# Patient Record
Sex: Male | Born: 1990
Health system: Southern US, Community
[De-identification: ages and names within clinical notes are randomized; demographics above are authoritative.]

## PROBLEM LIST (undated history)

## (undated) HISTORY — PX: MANDIBLE FRACTURE SURGERY: SHX706

## (undated) HISTORY — PX: COSMETIC SURGERY: SHX468

---

## 2016-10-03 ENCOUNTER — Ambulatory Visit
Admission: EM | Admit: 2016-10-03 | Discharge: 2016-10-03 | Disposition: A | Discharge status: Home / Self Care | Payer: 59 | Attending: Family Medicine | Admitting: Family Medicine

## 2016-10-03 ENCOUNTER — Ambulatory Visit (INDEPENDENT_AMBULATORY_CARE_PROVIDER_SITE_OTHER): Payer: 59

## 2016-10-03 DIAGNOSIS — S02601B Fracture of unspecified part of body of right mandible, initial encounter for open fracture: Secondary | ICD-10-CM

## 2016-10-03 DIAGNOSIS — S02642A Fracture of ramus of left mandible, initial encounter for closed fracture: Secondary | ICD-10-CM | POA: Diagnosis not present
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
## 2016-10-03 NOTE — Discharge Instructions (Signed)
Re[facility] now for further evaluation and management

## 2017-02-20 ENCOUNTER — Ambulatory Visit (INDEPENDENT_AMBULATORY_CARE_PROVIDER_SITE_OTHER): Payer: 59 | Admitting: Podiatry

## 2017-02-20 ENCOUNTER — Encounter: Payer: Self-pay | Admitting: Podiatry

## 2017-02-20 DIAGNOSIS — M79609 Pain in unspecified limb: Secondary | ICD-10-CM

## 2017-02-20 DIAGNOSIS — B351 Tinea unguium: Secondary | ICD-10-CM | POA: Diagnosis not present

## 2017-02-20 NOTE — Progress Notes (Signed)
   Subjective:    Patient ID: Paul Mcconnell, male    DOB: 02-02-1991, 26 y.o.   MRN: [id number] . HPIthis patient presents to the office stating that his toenails are discolored and are falling off his toes.  He says there is no pain or drainage from the toenails.  He does give a history of having played soccer years ago.  He says that the toenails on his right foot while worse than the toenails on his left foot.  He  has provided no treatment nor sought any professional help.  He presents the office today for an evaluation of these nails.    Review of Systems  All other systems reviewed and are negative.      Objective:   Physical Exam General Appearance  Alert, conversant and in no acute stress.  Vascular  Dorsalis pedis and posterior pulses are palpable  bilaterally.  Capillary return is within normal limits  Bilaterally. Temperature is within normal limits  Bilaterally  Neurologic  Senn-Weinstein monofilament wire test within normal limits  bilaterally. Muscle power  Within normal limits bilaterally.  Nails Thick disfigured discolored nails with subungual debris 3-5 right foot.. No evidence of bacterial infection or drainage bilaterally.  Orthopedic  No limitations of motion of motion feet bilaterally.  No crepitus or effusions noted.  Mild HAV  B/L.  Skin  normotropic skin with no porokeratosis noted bilaterally.  No signs of infections or ulcers noted.          Assessment & Plan:  Onychomycosis  3-5 right  IE  Examined his foot and the 3 nails on his right foot appear to be fungal in nature.  His nails have only last week and cut short.  I was unable to get a sample of the nail to send to the lab.  I told him to return to the office in 3 weeks at which time ample of the nail will  taken and sent to the lab.  o consider Lamisil treatment in the future.   Helane GuntherGregory Roark Rufo DPM

## 2017-03-06 ENCOUNTER — Encounter: Payer: Self-pay | Admitting: *Deleted

## 2017-03-06 ENCOUNTER — Ambulatory Visit
Admission: EM | Admit: 2017-03-06 | Discharge: 2017-03-06 | Disposition: A | Discharge status: Home / Self Care | Payer: 59 | Attending: Family Medicine | Admitting: Family Medicine

## 2017-03-06 DIAGNOSIS — L03012 Cellulitis of left finger: Secondary | ICD-10-CM

## 2017-03-06 MED ORDER — SULFAMETHOXAZOLE-TRIMETHOPRIM 800-160 MG PO TABS: 1.0000 | ORAL_TABLET | Freq: Two times a day (BID) | ORAL | 0 refills | Status: DC

## 2017-03-06 NOTE — ED Triage Notes (Signed)
Left middle finger nail area is red, edematous, and painful, x1 week. Denies injury.
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
## 2017-03-13 ENCOUNTER — Ambulatory Visit: Payer: 59 | Admitting: Podiatry

## 2017-03-20 ENCOUNTER — Encounter: Payer: Self-pay | Admitting: Podiatry

## 2017-03-20 ENCOUNTER — Ambulatory Visit (INDEPENDENT_AMBULATORY_CARE_PROVIDER_SITE_OTHER): Payer: 59 | Admitting: Podiatry

## 2017-03-20 DIAGNOSIS — B351 Tinea unguium: Secondary | ICD-10-CM

## 2017-03-20 DIAGNOSIS — L603 Nail dystrophy: Secondary | ICD-10-CM

## 2017-03-20 DIAGNOSIS — M79609 Pain in unspecified limb: Secondary | ICD-10-CM

## 2017-03-20 NOTE — Progress Notes (Signed)
   Subjective:    Patient ID: Paul Mcconnell, male    DOB: 12/20/1990, 26 y.o.   MRN: [id number] . HPIthis patient presents to the office stating that his toenails are discolored and are falling off his toes.  He says there is no pain or drainage from the toenails.  He does give a history of having played soccer years ago.  He says that the toenails on his right foot while worse than the toenails on his left foot.  He  has provided no treatment nor sought any professional help.  He presents the office today for an evaluation of these nails.    Review of Systems  All other systems reviewed and are negative.      Objective:   Physical Exam General Appearance  Alert, conversant and in no acute stress.  Vascular  Dorsalis pedis and posterior pulses are palpable  bilaterally.  Capillary return is within normal limits  Bilaterally. Temperature is within normal limits  Bilaterally  Neurologic  Senn-Weinstein monofilament wire test within normal limits  bilaterally. Muscle power  Within normal limits bilaterally.  Nails Thick disfigured discolored nails with subungual debris 3-5 right foot.. No evidence of bacterial infection or drainage bilaterally.  Orthopedic  No limitations of motion of motion feet bilaterally.  No crepitus or effusions noted.  Mild HAV  B/L.  Skin  normotropic skin with no porokeratosis noted bilaterally.  No signs of infections or ulcers noted.          Assessment & Plan:  Onychomycosis  3-5 right  Nail sample was taken from his nails  and was sent to the lab to determine if there are any fungal elements.  Will call with the results   Helane GuntherGregory Glendell Schlottman DPM

## 2017-04-05 ENCOUNTER — Telehealth: Payer: Self-pay | Admitting: Podiatry

## 2017-04-05 DIAGNOSIS — Z79899 Other long term (current) drug therapy: Secondary | ICD-10-CM

## 2017-04-05 NOTE — Telephone Encounter (Signed)
Patient called wanting his results from [facility]. Looks like report is in hi chart. Pt states if positive for fungus you can [facility] [place]. If not, call him if he needs to make another appointment.

## 2017-04-06 DIAGNOSIS — Z79899 Other long term (current) drug therapy: Secondary | ICD-10-CM | POA: Diagnosis not present

## 2017-04-06 MED ORDER — TERBINAFINE HCL 250 MG PO TABS: 250.0000 mg | ORAL_TABLET | Freq: Every day | ORAL | 0 refills | Status: DC

## 2017-04-06 NOTE — Telephone Encounter (Signed)
Patient has been notified of results and informed to pick up order for lab work.  He verbally understood not to start Lamisil until notified of lab results.  Script for Lamisil has been sent to pharmacy

## 2017-04-06 NOTE — Addendum Note (Signed)
Addended by: Geraldine ContrasVENABLE, ANGELA D on: 04/06/2017 10:05 AM   Modules accepted: Orders

## 2017-04-06 NOTE — Telephone Encounter (Addendum)
Helane GuntherMayer, Gregory, DPM sent to Constance HawVenable, Leianne Callins D, LPN        He has fungus. Have him come by for blood work and send him in Lamisil for 90 day supply, he can follow up as needed  Previous Messages        Fungus culture w smear  Order: [id number]  Status:  Final result Visible to patient:  No (Not Released) Dx:  Nail dystrophy         Specimen Collected: 03/20/17 Last Resulted: 04/03/17 08:18      Order Details     View Encounter     Lab and Collection Details     Routing     Result History        Scans on Order [id number]   Scan on 04/03/2017 8:19 AM by Araceli BoucheHoneycutt, Tammy R: [facility]

## 2017-04-07 LAB — HEPATIC FUNCTION PANEL
ALT: 39 IU/L (ref 0–44)
AST: 24 IU/L (ref 0–40)
Albumin: 4.8 g/dL (ref 3.5–5.5)
Alkaline Phosphatase: 83 IU/L (ref 39–117)
BILIRUBIN TOTAL: 1.2 mg/dL (ref 0.0–1.2)
Bilirubin, Direct: 0.26 mg/dL (ref 0.00–0.40)
Total Protein: 7.5 g/dL (ref 6.0–8.5)

## 2017-04-11 ENCOUNTER — Telehealth: Payer: Self-pay | Admitting: Podiatry

## 2017-04-11 NOTE — Telephone Encounter (Signed)
Angie, can you please call this patient and discuss his lab results with him

## 2017-04-11 NOTE — Telephone Encounter (Signed)
Patient called wanting to know the results of his lab work liver test and also the results from the pathology. Advised pt that doctor would need to review and someone would give him a call. Results are attached in the chart for both.

## 2017-04-12 NOTE — Telephone Encounter (Signed)
Patient has been notified of lab results and informed ok to start medication.

## 2017-06-26 DIAGNOSIS — Z23 Encounter for immunization: Secondary | ICD-10-CM | POA: Diagnosis not present

## 2018-06-25 ENCOUNTER — Ambulatory Visit
Admission: EM | Admit: 2018-06-25 | Discharge: 2018-06-25 | Disposition: A | Discharge status: Home / Self Care | Payer: 59 | Attending: Family Medicine | Admitting: Family Medicine

## 2018-06-25 ENCOUNTER — Encounter: Payer: Self-pay | Admitting: Emergency Medicine

## 2018-06-25 ENCOUNTER — Other Ambulatory Visit: Payer: Self-pay

## 2018-06-25 DIAGNOSIS — J029 Acute pharyngitis, unspecified: Secondary | ICD-10-CM

## 2018-06-25 LAB — RAPID STREP SCREEN (MED CTR MEBANE ONLY): Streptococcus, Group A Screen (Direct): NEGATIVE

## 2018-06-25 MED ORDER — AMOXICILLIN 500 MG PO TABS: 500.0000 mg | ORAL_TABLET | Freq: Two times a day (BID) | ORAL | 0 refills | Status: AC
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
## 2018-06-25 NOTE — Discharge Instructions (Signed)
I am placing you on antibiotics while awaiting the culture.  Take care  Dr. Adriana Simas

## 2018-06-25 NOTE — ED Triage Notes (Signed)
Patient c/o cough and sore throat that started 4 days ago. Denies fever.

## 2018-06-27 ENCOUNTER — Telehealth (HOSPITAL_COMMUNITY): Payer: Self-pay | Admitting: Emergency Medicine

## 2018-06-27 LAB — CULTURE, GROUP A STREP (THRC)

## 2018-06-27 NOTE — Telephone Encounter (Signed)
Culture is positive for group A Strep germ.  Prescription for penicillin V 500mg  bid x 10d #20 no refills sent to the pharmacy of record. Pt contacted and made aware. Recheck for further evaluation if symptoms are not improving.  Verbalized understanding.   Pt was treated with antibiotics; attempted call no answer and no voicemail

## 2018-07-23 IMAGING — CR DG MANDIBLE 4+V
4 series · 5 of 5 positions shown · non-contrast
Comparison: None.

CLINICAL DATA: 25-year-old male with acute left jaw pain and
swelling following injury yesterday.

EXAM:
MANDIBLE - 4+ VIEW

[mandible pa]
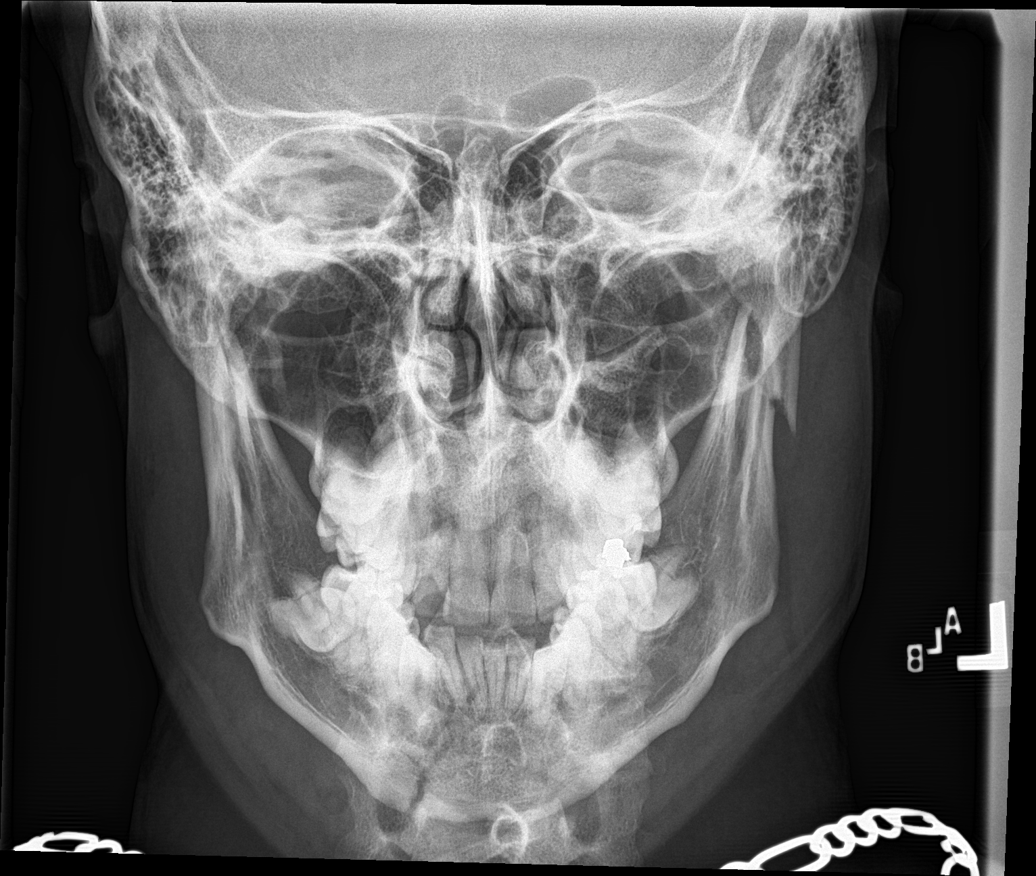

[mandible obl (1 of 2)]
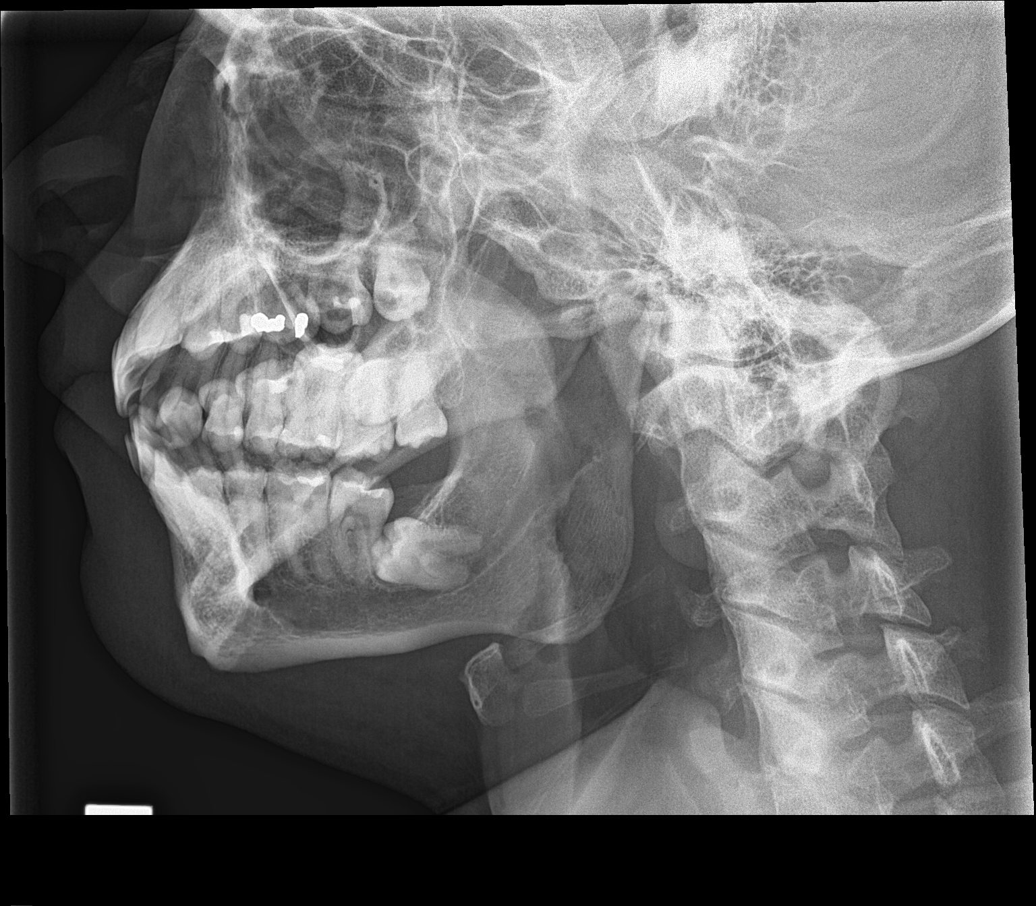

[Series 3: mandible obl · 0.14mm/px · 2 of 2 slices shown (2 of 2)]
[im 1/2]
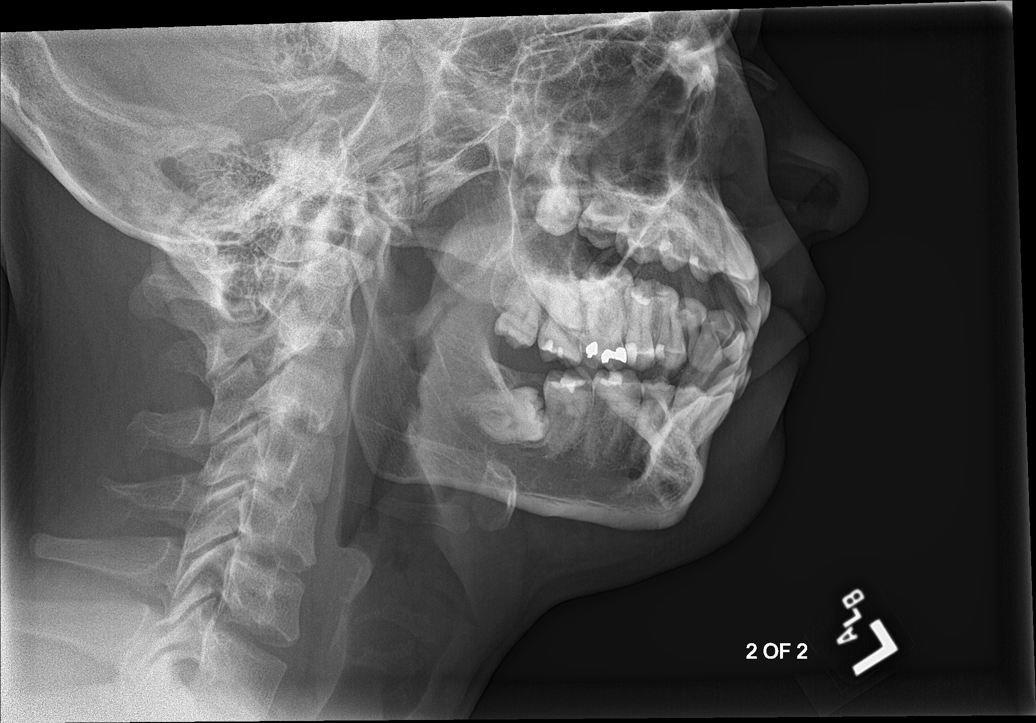
[im 2/2]
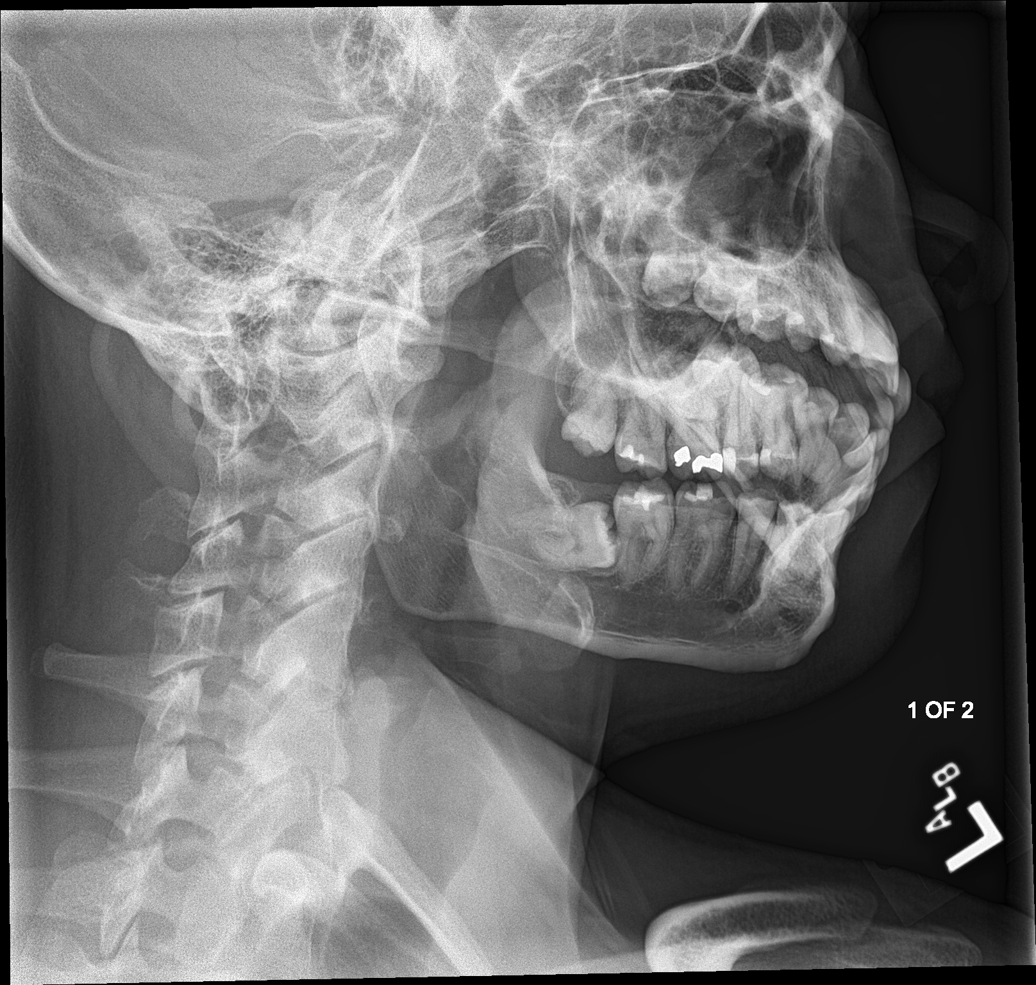

[mandible townes]
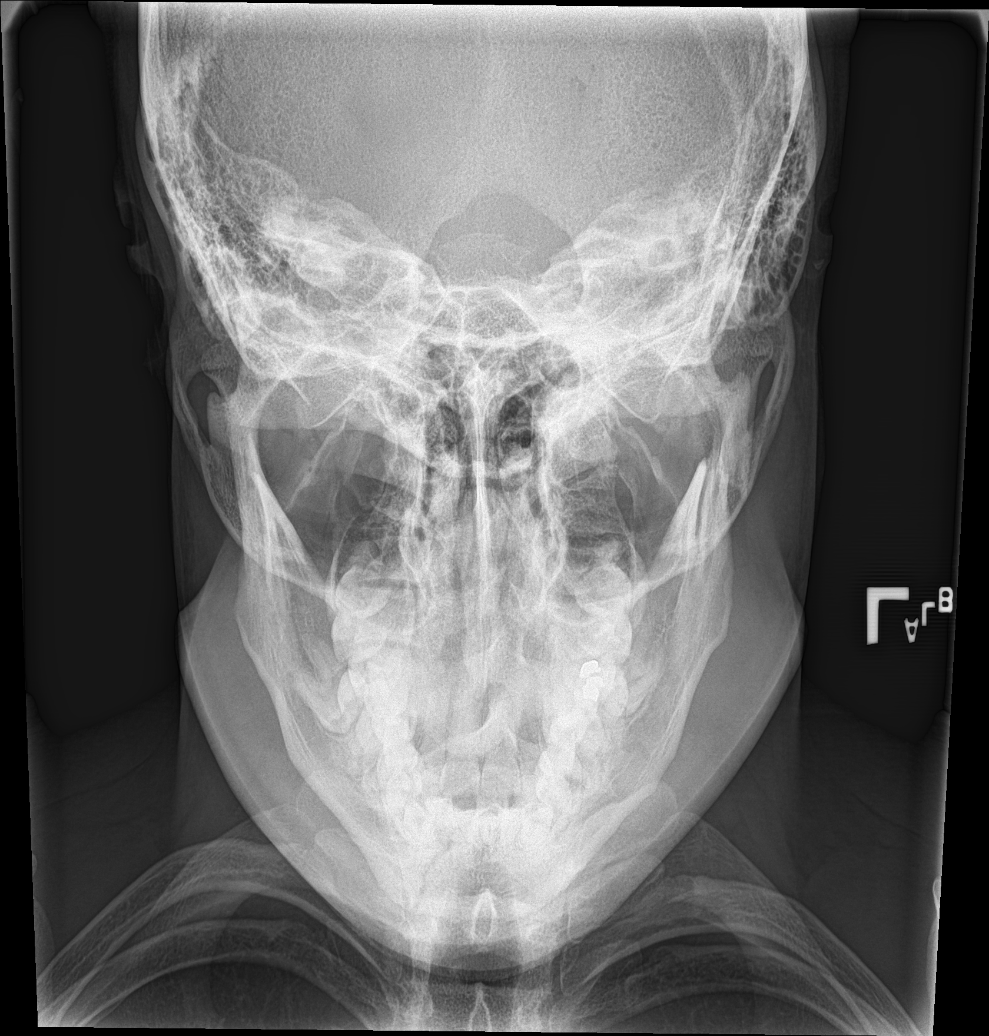

[5 of 5 positions shown; findings below may reference images not displayed]

FINDINGS: A fracture of the left mandibular ramus/condyle is noted with
approximately 4 mm displacement.

A fracture of the anterior right mandibular body is noted and
appears nondisplaced.

The mandibular condyles appear located on this study.
IMPRESSION: Fractures of the left mandibular ramus/ condyles and anterior right
mandibular body. Consider CT for further evaluation and
characterization.
